# Patient Record
Sex: Male | Born: 1965 | Hispanic: Yes | Marital: Married | Smoking: Never smoker
Health system: Southern US, Community
[De-identification: ages and names within clinical notes are randomized; demographics above are authoritative.]

---

## 2019-05-13 ENCOUNTER — Emergency Department (HOSPITAL_COMMUNITY)
Admission: EM | Admit: 2019-05-13 | Discharge: 2019-05-13 | Disposition: A | Discharge status: Home / Self Care | Payer: Self-pay | Attending: Emergency Medicine | Admitting: Emergency Medicine

## 2019-05-13 ENCOUNTER — Encounter (HOSPITAL_COMMUNITY): Payer: Self-pay | Admitting: Emergency Medicine

## 2019-05-13 ENCOUNTER — Other Ambulatory Visit: Payer: Self-pay

## 2019-05-13 ENCOUNTER — Emergency Department (HOSPITAL_COMMUNITY): Payer: Self-pay

## 2019-05-13 DIAGNOSIS — R52 Pain, unspecified: Secondary | ICD-10-CM

## 2019-05-13 DIAGNOSIS — R300 Dysuria: Secondary | ICD-10-CM | POA: Insufficient documentation

## 2019-05-13 LAB — URINALYSIS, ROUTINE W REFLEX MICROSCOPIC
Bilirubin Urine: NEGATIVE
Glucose, UA: 150 mg/dL — AB
Hgb urine dipstick: NEGATIVE
Ketones, ur: NEGATIVE mg/dL
Leukocytes,Ua: NEGATIVE
Nitrite: NEGATIVE
Protein, ur: NEGATIVE mg/dL
Specific Gravity, Urine: 1.012 (ref 1.005–1.030)
pH: 6 (ref 5.0–8.0)

## 2019-05-13 MED ORDER — DOXYCYCLINE HYCLATE 100 MG PO CAPS: ORAL_CAPSULE | ORAL | 0 refills | Status: AC

## 2019-05-13 MED ORDER — DOXYCYCLINE HYCLATE 100 MG PO CAPS: 100.0000 mg | ORAL_CAPSULE | Freq: Two times a day (BID) | ORAL | 0 refills | Status: DC

## 2019-05-13 NOTE — ED Triage Notes (Signed)
Pt reports pain with urination and pain under testicles that radiates into lower abd x 1 year.  States he has taken medication for same in the past and it will go away and return.

## 2019-05-13 NOTE — Discharge Instructions (Signed)
Follow-up with Dr. Alyson Ingles or one of his partners in 1 to 2 weeks

## 2019-05-13 NOTE — ED Notes (Signed)
Patient transported to [facility] 

## 2019-05-13 NOTE — ED Notes (Signed)
Patient returned from [facility]

## 2019-05-13 NOTE — ED Provider Notes (Signed)
[facility] EMERGENCY DEPARTMENT Provider Note   CSN: [id number] Arrival date & time: 05/13/19  1002     History   Chief Complaint Chief Complaint  Patient presents with   Dysuria    HPI [place] is a 53 y.o. male.     Patient complains of pain in his scrotum.  He also complains of dysuria this is been going on for couple months  The history is provided by the patient.  Dysuria Presenting symptoms: dysuria   Context: not after injury   Relieved by:  Nothing Worsened by:  Nothing Ineffective treatments:  None tried Associated symptoms: groin pain   Associated symptoms: no abdominal pain, no diarrhea, no hematuria and no urinary frequency     History reviewed. No pertinent past medical history.  There are no active problems to display for this patient.   History reviewed. No pertinent surgical history.      Home Medications    Prior to Admission medications   Medication Sig Start Date End Date Taking? Authorizing Provider  doxycycline (VIBRAMYCIN) 100 MG capsule One po bid 05/13/19   Bethann Berkshire, MD    Family History No family history on file.  Social History Social History   Tobacco Use   Smoking status: Never Smoker   Smokeless tobacco: Never Used  Substance Use Topics   Alcohol use: Not Currently   Drug use: Never     Allergies   Patient has no allergy information on record.   Review of Systems Review of Systems  Constitutional: Negative for appetite change and fatigue.  HENT: Negative for congestion, ear discharge and sinus pressure.   Eyes: Negative for discharge.  Respiratory: Negative for cough.   Cardiovascular: Negative for chest pain.  Gastrointestinal: Negative for abdominal pain and diarrhea.  Genitourinary: Positive for dysuria. Negative for frequency and hematuria.  Musculoskeletal: Negative for back pain.  Skin: Negative for rash.  Neurological: Negative for seizures and headaches.    Psychiatric/Behavioral: Negative for hallucinations.     Physical Exam Updated Vital Signs BP 136/90    Pulse 84    Temp (!) 97.4 F (36.3 C) (Oral)    Resp 16    Ht 5' 6.93" (1.7 m)    Wt 74 kg    SpO2 98%    BMI 25.61 kg/m   Physical Exam Vitals signs and nursing note reviewed.  Constitutional:      Appearance: He is well-developed.  HENT:     Head: Normocephalic.     Nose: Nose normal.  Eyes:     General: No scleral icterus.    Conjunctiva/sclera: Conjunctivae normal.  Neck:     Musculoskeletal: Neck supple.     Thyroid: No thyromegaly.  Cardiovascular:     Rate and Rhythm: Normal rate and regular rhythm.     Heart sounds: No murmur. No friction rub. No gallop.   Pulmonary:     Breath sounds: No stridor. No wheezing or rales.  Chest:     Chest wall: No tenderness.  Abdominal:     General: There is no distension.     Tenderness: There is no abdominal tenderness. There is no rebound.  Genitourinary:    Comments: Tender scrotum Musculoskeletal: Normal range of motion.  Lymphadenopathy:     Cervical: No cervical adenopathy.  Skin:    Findings: No erythema or rash.  Neurological:     Mental Status: He is oriented to person, place, and time.     Motor: No abnormal  muscle tone.     Coordination: Coordination normal.  Psychiatric:        Behavior: Behavior normal.      ED Treatments / Results  Labs (all labs ordered are listed, but only abnormal results are displayed) Labs Reviewed  URINALYSIS, ROUTINE W REFLEX MICROSCOPIC - Abnormal; Notable for the following components:      Result Value   Color, Urine STRAW (*)    Glucose, UA 150 (*)    All other components within normal limits    EKG None  Radiology US Scrotum W/doppler  Result Date: 05/13/2019 CLINICAL DATA:  Swelling and pain. EXAM: SCROTAL ULTRASOUND DOPPLER ULTRASOUND OF THE TESTICLES TECHNIQUE: Complete ultrasound examination of the testicles, epididymis, and other scrotal structures was  performed. Color and spectral Doppler ultrasound were also utilized to evaluate blood flow to the testicles. COMPARISON:  None. FINDINGS: Right testicle Measurements: 4.2 x 1.7 x 2.4 cm. No mass or microlithiasis visualized. Left testicle Measurements: 3.9 x 1.9 x 2.8 cm. No mass or microlithiasis visualized. Right epididymis:  Contains a 6 mm epididymal cysts. Left epididymis:  Normal in size and appearance. Hydrocele:  There is a small right hydrocele. Varicocele:  Small bilateral varicoceles noted. Pulsed Doppler interrogation of both testes demonstrates normal low resistance arterial and venous waveforms bilaterally. IMPRESSION: 1. The testicles are normal in appearance with no evidence of torsion. 2. Right epididymal cyst. 3. Pain over the left testicle of uncertain etiology. 4. Small right hydrocele. 5. Small bilateral varicoceles. Electronically Signed   By: Dorise Bullion III M.D   On: 05/13/2019 13:23    Procedures Procedures (including critical care time)  Medications Ordered in ED Medications - No data to display   Initial Impression / Assessment and Plan / ED Course  I have reviewed the triage vital signs and the nursing notes.  Pertinent labs & imaging results that were available during my care of the patient were reviewed by me and considered in my medical decision making (see chart for details).        Ultrasound and urinalysis unremarkable.  Patient complains of scrotal pain and dysuria.  We will empirically treat him with some doxycycline and have him follow-up with urology Final Clinical Impressions(s) / ED Diagnoses   Final diagnoses:  Dysuria    ED Discharge Orders         Ordered    doxycycline (VIBRAMYCIN) 100 MG capsule  2 times daily,   Status:  Discontinued     05/13/19 1329    doxycycline (VIBRAMYCIN) 100 MG capsule     05/13/19 1331           Milton Ferguson, MD 05/13/19 1335

## 2020-06-10 IMAGING — US US SCROTUM W/ DOPPLER COMPLETE
1 series · 14 of 25 positions shown · non-contrast
Comparison: None.

CLINICAL DATA: Swelling and pain.

EXAM:
SCROTAL ULTRASOUND
DOPPLER ULTRASOUND OF THE TESTICLES
TECHNIQUE: Complete ultrasound examination of the testicles, epididymis, and
other scrotal structures was performed. Color and spectral Doppler
ultrasound were also utilized to evaluate blood flow to the
testicles.

[Series 1: us scrotum w/ doppler complete · 14 of 73 slices shown]
[im 1/73]
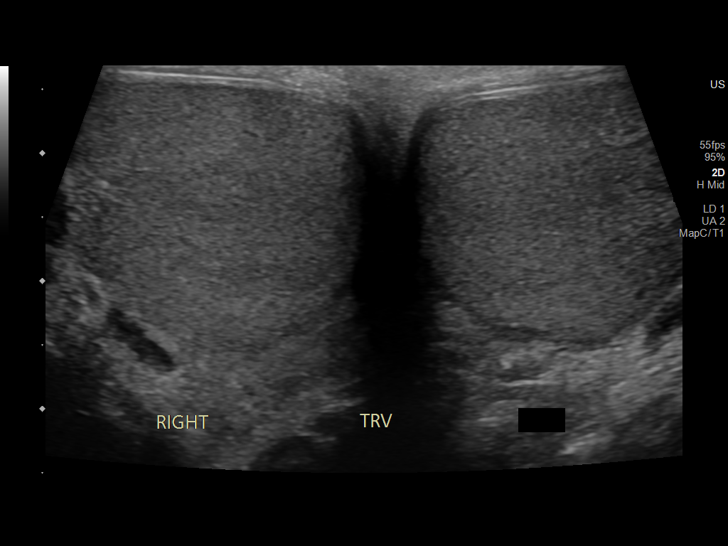
[im 7/73]
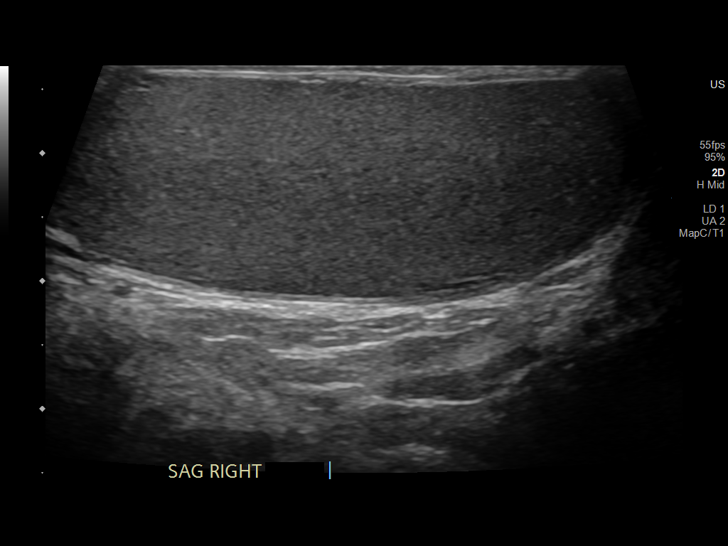
[im 13/73]
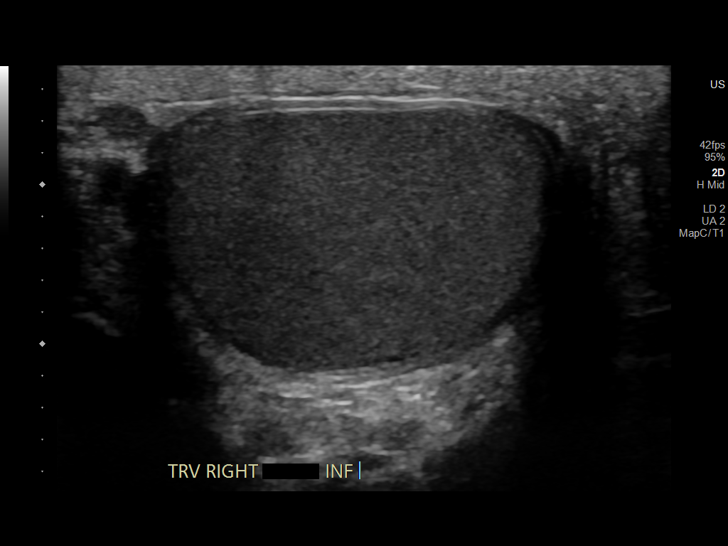
[im 19/73]
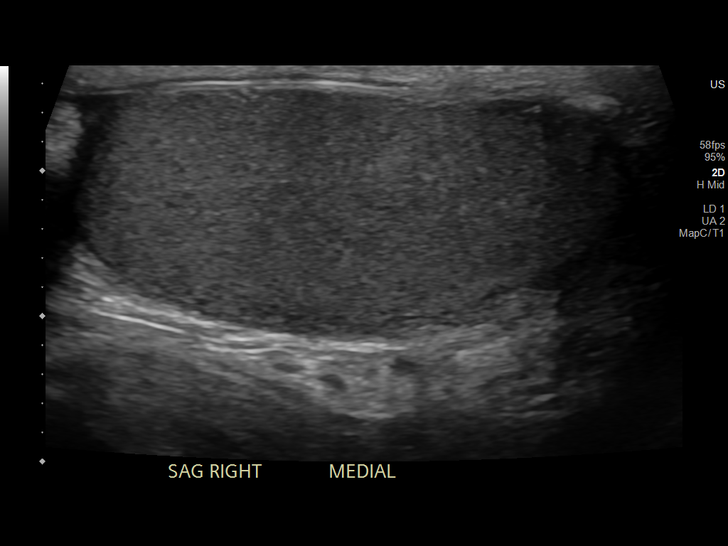
[im 25/73]
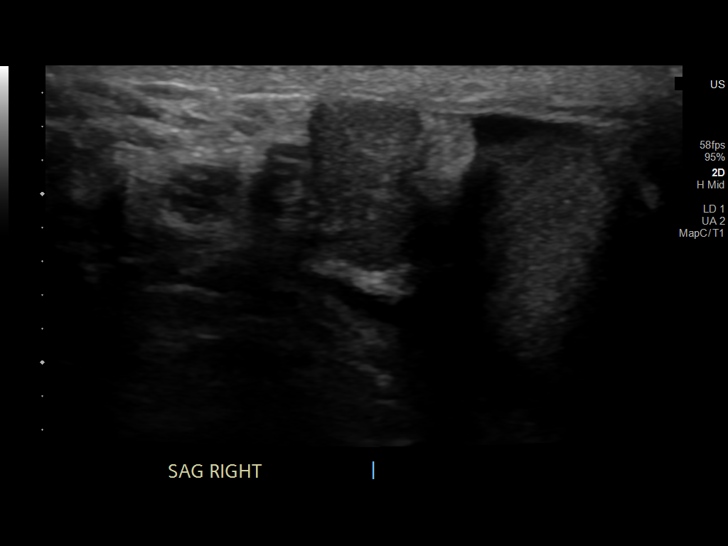
[im 28/73]
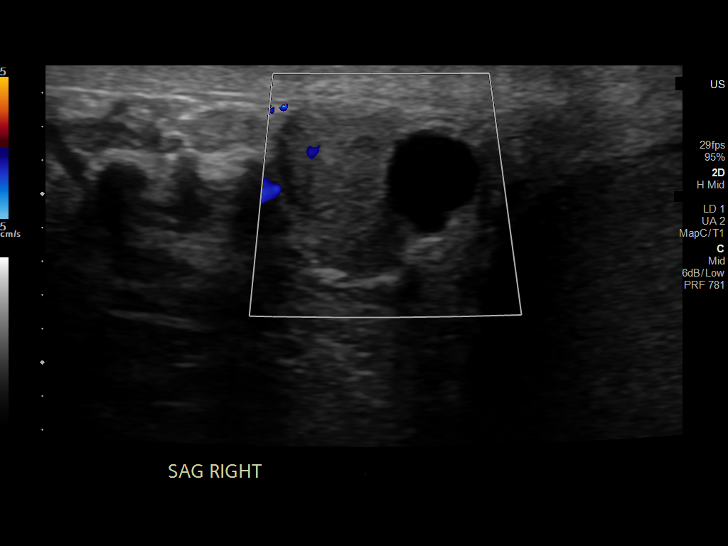
[im 34/73]
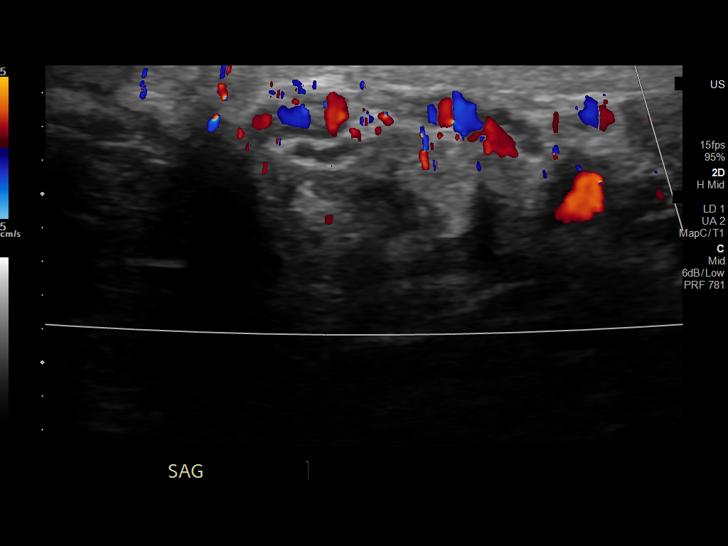
[im 40/73]
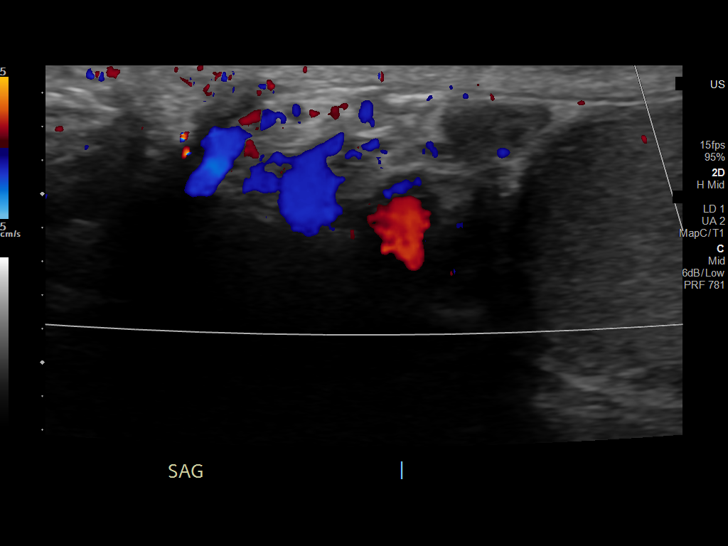
[im 46/73]
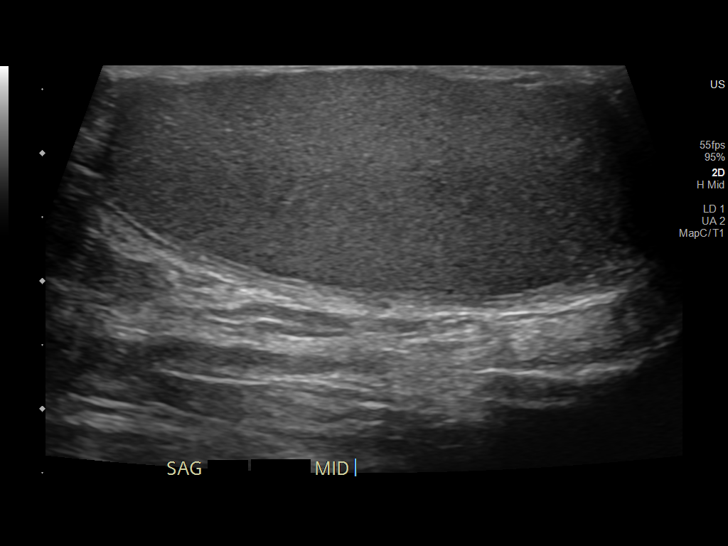
[im 49/73]
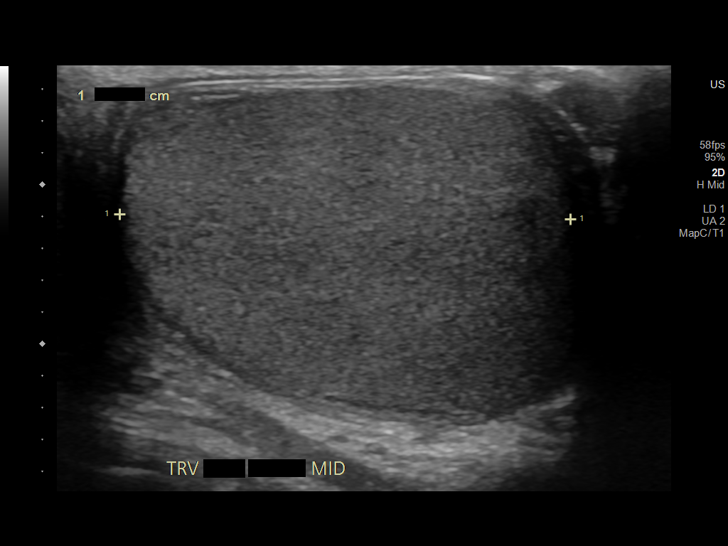
[im 55/73]
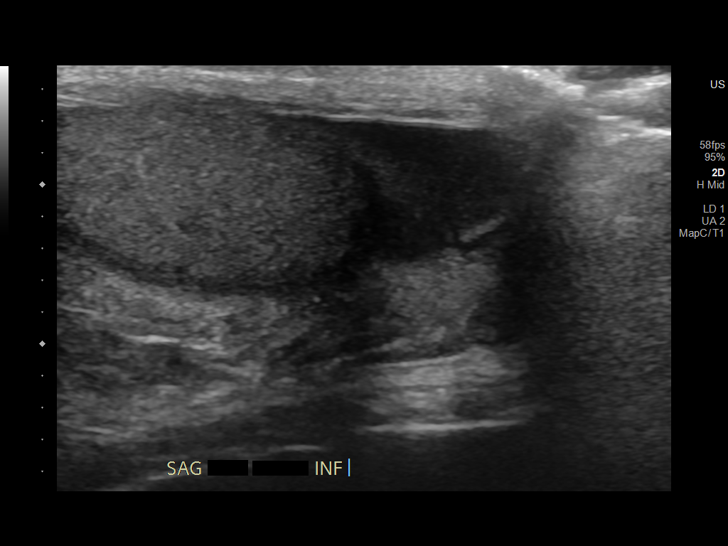
[im 61/73]
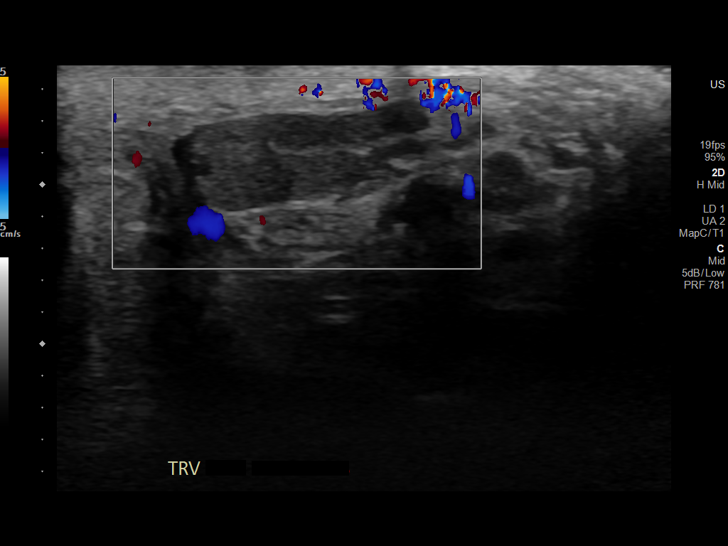
[im 67/73]
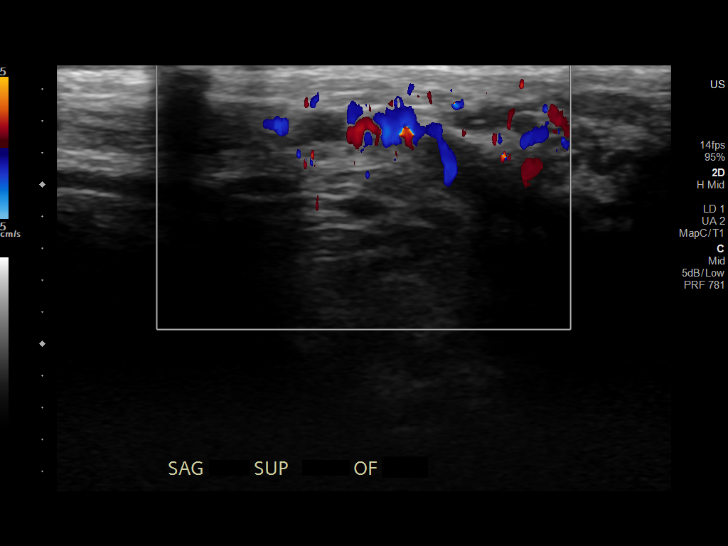
[im 73/73]
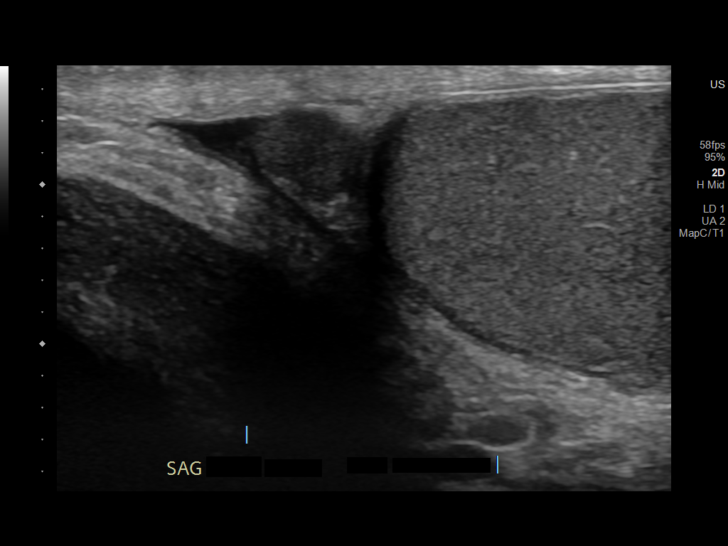

[14 of 25 positions shown; findings below may reference images not displayed]

FINDINGS: Right testicle

Measurements: 4.2 x 1.7 x 2.4 cm. No mass or microlithiasis
visualized.

Left testicle

Measurements: 3.9 x 1.9 x 2.8 cm. No mass or microlithiasis
visualized.

Right epididymis:  Contains a 6 mm epididymal cysts.

Left epididymis:  Normal in size and appearance.

Hydrocele:  There is a small right hydrocele.

Varicocele:  Small bilateral varicoceles noted.

Pulsed Doppler interrogation of both testes demonstrates normal low
resistance arterial and venous waveforms bilaterally.
IMPRESSION: 1. The testicles are normal in appearance with no evidence of
torsion.
2. Right epididymal cyst.
3. Pain over the left testicle of uncertain etiology.
4. Small right hydrocele.
5. Small bilateral varicoceles.
